# Patient Record
Sex: Male | Born: 1945 | Race: White | Hispanic: No | Marital: Married | Smoking: Never smoker
Health system: Southern US, Community
[De-identification: ages and names within clinical notes are randomized; demographics above are authoritative.]

## PROBLEM LIST (undated history)

## (undated) DIAGNOSIS — Z955 Presence of coronary angioplasty implant and graft: Secondary | ICD-10-CM

## (undated) DIAGNOSIS — M1990 Unspecified osteoarthritis, unspecified site: Secondary | ICD-10-CM

---

## 2013-02-12 HISTORY — PX: CORONARY STENT INTERVENTION: CATH118234

## 2018-12-26 ENCOUNTER — Emergency Department (HOSPITAL_COMMUNITY): Payer: Medicare Other

## 2018-12-26 ENCOUNTER — Emergency Department (HOSPITAL_COMMUNITY)
Admission: EM | Admit: 2018-12-26 | Discharge: 2018-12-26 | Disposition: A | Discharge status: Home / Self Care | Payer: Medicare Other | Attending: Emergency Medicine | Admitting: Emergency Medicine

## 2018-12-26 ENCOUNTER — Encounter (HOSPITAL_COMMUNITY): Payer: Self-pay | Admitting: Emergency Medicine

## 2018-12-26 ENCOUNTER — Other Ambulatory Visit: Payer: Self-pay

## 2018-12-26 DIAGNOSIS — H539 Unspecified visual disturbance: Secondary | ICD-10-CM | POA: Diagnosis not present

## 2018-12-26 HISTORY — DX: Unspecified osteoarthritis, unspecified site: M19.90

## 2018-12-26 HISTORY — DX: Presence of coronary angioplasty implant and graft: Z95.5

## 2018-12-26 LAB — DIFFERENTIAL
Abs Immature Granulocytes: 0.03 10*3/uL (ref 0.00–0.07)
Basophils Absolute: 0.1 10*3/uL (ref 0.0–0.1)
Basophils Relative: 1 %
Eosinophils Absolute: 0.6 10*3/uL — ABNORMAL HIGH (ref 0.0–0.5)
Eosinophils Relative: 5 %
Immature Granulocytes: 0 %
Lymphocytes Relative: 34 %
Lymphs Abs: 3.9 10*3/uL (ref 0.7–4.0)
Monocytes Absolute: 1 10*3/uL (ref 0.1–1.0)
Monocytes Relative: 9 %
Neutro Abs: 5.8 10*3/uL (ref 1.7–7.7)
Neutrophils Relative %: 51 %

## 2018-12-26 LAB — COMPREHENSIVE METABOLIC PANEL
ALT: 25 U/L (ref 0–44)
AST: 26 U/L (ref 15–41)
Albumin: 4.1 g/dL (ref 3.5–5.0)
Alkaline Phosphatase: 58 U/L (ref 38–126)
Anion gap: 12 (ref 5–15)
BUN: 12 mg/dL (ref 8–23)
CO2: 24 mmol/L (ref 22–32)
Calcium: 9.6 mg/dL (ref 8.9–10.3)
Chloride: 104 mmol/L (ref 98–111)
Creatinine, Ser: 1.09 mg/dL (ref 0.61–1.24)
GFR calc Af Amer: >60 mL/min (ref >60)
GFR calc non Af Amer: >60 mL/min (ref >60)
Glucose, Bld: 105 mg/dL — ABNORMAL HIGH (ref 70–99)
Potassium: 4 mmol/L (ref 3.5–5.1)
Sodium: 140 mmol/L (ref 135–145)
Total Bilirubin: 0.9 mg/dL (ref 0.3–1.2)
Total Protein: 7.6 g/dL (ref 6.5–8.1)

## 2018-12-26 LAB — I-STAT CHEM 8, ED
BUN: 13 mg/dL (ref 8–23)
Calcium, Ion: 1.17 mmol/L (ref 1.15–1.40)
Chloride: 103 mmol/L (ref 98–111)
Creatinine, Ser: 1.1 mg/dL (ref 0.61–1.24)
Glucose, Bld: 101 mg/dL — ABNORMAL HIGH (ref 70–99)
HCT: 47 % (ref 39.0–52.0)
Hemoglobin: 16 g/dL (ref 13.0–17.0)
Potassium: 3.7 mmol/L (ref 3.5–5.1)
Sodium: 141 mmol/L (ref 135–145)
TCO2: 23 mmol/L (ref 22–32)

## 2018-12-26 LAB — CBC
HCT: 48.4 % (ref 39.0–52.0)
Hemoglobin: 16.1 g/dL (ref 13.0–17.0)
MCH: 31.2 pg (ref 26.0–34.0)
MCHC: 33.3 g/dL (ref 30.0–36.0)
MCV: 93.8 fL (ref 80.0–100.0)
Platelets: 232 10*3/uL (ref 150–400)
RBC: 5.16 MIL/uL (ref 4.22–5.81)
RDW: 12.5 % (ref 11.5–15.5)
WBC: 11.5 10*3/uL — ABNORMAL HIGH (ref 4.0–10.5)
nRBC: 0 % (ref 0.0–0.2)

## 2018-12-26 LAB — CBG MONITORING, ED: Glucose-Capillary: 103 mg/dL — ABNORMAL HIGH (ref 70–99)

## 2018-12-26 LAB — PROTIME-INR
INR: 1.1 (ref 0.8–1.2)
Prothrombin Time: 13.7 seconds (ref 11.4–15.2)

## 2018-12-26 LAB — APTT: aPTT: 32 seconds (ref 24–36)

## 2018-12-26 MED ORDER — SODIUM CHLORIDE 0.9% FLUSH: 3.0000 mL | Freq: Once | INTRAVENOUS | Status: DC

## 2018-12-26 NOTE — ED Notes (Signed)
Patient transported to MRI 

## 2018-12-26 NOTE — Discharge Instructions (Signed)
You were seen in the emergency department today with changes in your vision.  The MRI of your brain did not show a stroke.  Please continue to follow with your eye doctor.  Return to the emergency department any new or worsening symptom such as weakness, numbness, worsening vision change, difficulty swallowing, difficulty speaking.

## 2018-12-26 NOTE — ED Triage Notes (Signed)
Pt is coming from the Retina Specialist for seeing streaks of red, blue, and yellow in his eyes 3 weeks ago felt like it was in his head. They want him to be evaluated for stroke. There is no slurred speech, facial droop. Grips equal .  Eyes are dilated

## 2018-12-26 NOTE — ED Notes (Signed)
EDP notified. 

## 2018-12-26 NOTE — ED Provider Notes (Signed)
Emergency Department Provider Note   I have reviewed the triage vital signs and the nursing notes.   HISTORY  Chief Complaint Vison Changes and Extremity Weakness   HPI Alfred Gonzalez is a 73 y.o. male with past medical history reviewed below presents to the emergency department with vision change 3 weeks ago but ultimately referred here by his retina specialist for stroke evaluation.  Patient states that 3 weeks ago he developed streaks of red, blue, yellow in his eyes bilaterally.  He went about his day but felt like at times he was missing part of his field of vision.  He did have headache at the time and does have history of migraine but states that the headache resolved but the vision disturbance remained.  He saw his eye doctor yesterday who referred him to a retina specialist today who, by patient report, did not find a retina defect or other cause for the symptoms and referred the patient to the emergency department for further evaluation.  Patient states that over the past 3 weeks he feels like his vision is actually improving overall.  He is not having any weakness, numbness, speech disturbance, or difficulty swallowing.  He has no prior history of stroke.  He has not had return of headache.  No fevers, chills, heart palpitations.   Past Medical History:  Diagnosis Date   Arthritis    History of coronary angioplasty with insertion of stent     There are no active problems to display for this patient.   Past Surgical History:  Procedure Laterality Date   CORONARY STENT INTERVENTION  2015    Allergies Patient has no allergy information on record.  No family history on file.  Social History Social History   Tobacco Use   Smoking status: Never Smoker   Smokeless tobacco: Never Used  Substance Use Topics   Alcohol use: Yes   Drug use: Not Currently    Review of Systems  Constitutional: No fever/chills Eyes: Positive vision changes.  ENT: No sore  throat. Cardiovascular: Denies chest pain. Respiratory: Denies shortness of breath. Gastrointestinal: No abdominal pain.  No nausea, no vomiting.  No diarrhea.  No constipation. Genitourinary: Negative for dysuria. Musculoskeletal: Negative for back pain. Skin: Negative for rash. Neurological: Negative for headaches, focal weakness or numbness.  10-point ROS otherwise negative.  ____________________________________________   PHYSICAL EXAM:  VITAL SIGNS: ED Triage Vitals  Enc Vitals Group     BP 12/26/18 1415 (!) 171/82     Pulse Rate 12/26/18 1415 60     Resp 12/26/18 1415 16     Temp 12/26/18 1415 98.3 F (36.8 C)     Temp Source 12/26/18 1415 Oral     SpO2 12/26/18 1415 98 %   Constitutional: Alert and oriented. Well appearing and in no acute distress. Eyes: Conjunctivae are normal. Pupils are dilated bilaterally after exam at ophthalmology immediately prior to ED presentation.  Head: Atraumatic. Nose: No congestion/rhinnorhea. Mouth/Throat: Mucous membranes are moist.   Neck: No stridor.  Cardiovascular: Normal rate, regular rhythm. Good peripheral circulation. Grossly normal heart sounds.   Respiratory: Normal respiratory effort.  No retractions. Lungs CTAB. Gastrointestinal: Soft and nontender. No distention.  Musculoskeletal: No lower extremity tenderness nor edema. Neurologic:  Normal speech and language.  Skin:  Skin is warm, dry and intact. No rash noted.   ____________________________________________   LABS (all labs ordered are listed, but only abnormal results are displayed)  Labs Reviewed  CBC - Abnormal; Notable for  the following components:      Result Value   WBC 11.5 (*)    All other components within normal limits  DIFFERENTIAL - Abnormal; Notable for the following components:   Eosinophils Absolute 0.6 (*)    All other components within normal limits  COMPREHENSIVE METABOLIC PANEL - Abnormal; Notable for the following components:   Glucose,  Bld 105 (*)    All other components within normal limits  I-STAT CHEM 8, ED - Abnormal; Notable for the following components:   Glucose, Bld 101 (*)    All other components within normal limits  CBG MONITORING, ED - Abnormal; Notable for the following components:   Glucose-Capillary 103 (*)    All other components within normal limits  PROTIME-INR  APTT   ____________________________________________  EKG   EKG Interpretation  Date/Time:  Friday December 26 2018 14:58:59 EST Ventricular Rate:  65 PR Interval:  176 QRS Duration: 80 QT Interval:  404 QTC Calculation: 420 R Axis:   57 Text Interpretation: Sinus rhythm with occasional Premature ventricular complexes Otherwise normal ECG No STEMI Confirmed by Alona BeneLong, Trachelle Low [phone]([id number]) on 12/26/2018 5:40:04 PM       ____________________________________________  RADIOLOGY  Ct Head Wo Contrast  Result Date: 12/26/2018 CLINICAL DATA:  Visual loss or uveitis/scleritis. Possible stroke. Bilateral visual loss 3 weeks. EXAM: CT HEAD WITHOUT CONTRAST TECHNIQUE: Contiguous axial images were obtained from the base of the skull through the vertex without intravenous contrast. COMPARISON:  None. FINDINGS: Brain: Ventricles, cisterns and other CSF spaces are normal. There is mild chronic ischemic microvascular disease. There is no mass, mass effect, shift of midline structures or acute hemorrhage. No evidence of acute infarction. Calcification over the cerebellar hemispheres bilaterally right worse than left. Vascular: No hyperdense vessel or unexpected calcification. Skull: Normal. Negative for fracture or focal lesion. Sinuses/Orbits: Orbits are normal. Paranasal sinuses are well developed with minimal opacification over the ethmoid sinus and mild mucosal membrane thickening over the left maxillary sinus. Mastoid air cells are clear. Other: None. IMPRESSION: 1.  No acute findings. 2. Minimal chronic ischemic microvascular disease. Calcification over the  cerebellar hemispheres bilaterally right worse than left. Electronically Signed   By: Elberta Fortisaniel  Boyle M.D.   On: 12/26/2018 15:01   Mr Brain Wo Contrast  Result Date: 12/26/2018 CLINICAL DATA:  Initial evaluation for acute visual change. EXAM: MRI HEAD WITHOUT CONTRAST TECHNIQUE: Multiplanar, multiecho pulse sequences of the brain and surrounding structures were obtained without intravenous contrast. COMPARISON:  Prior head CT from earlier the same day. FINDINGS: Brain: Generalized age-related cerebral atrophy. Mild T2/FLAIR hyperintensity seen involving the periventricular and deep white matter both cerebral hemispheres, most consistent with chronic small vessel ischemic disease, minimal for age. Small focus of encephalomalacia of with chronic hemosiderin staining seen involving the left occipital lobe, likely a remote ischemic infarct. Susceptibility artifact related to cerebellar calcification noted as well. Tiny remote right cerebellar infarct noted. No abnormal foci of restricted diffusion to suggest acute or subacute ischemia. Gray-white matter differentiation maintained. No other areas of remote cortical infarction. No acute intracranial hemorrhage. No mass lesion, midline shift or mass effect. No hydrocephalus. No extra-axial fluid collection. Pituitary gland suprasellar region normal. Midline structures intact. Vascular: Major intracranial vascular flow voids are maintained. Skull and upper cervical spine: Craniocervical junction within normal limits. Degenerative disc bulging at C3-4 with resultant mild spinal stenosis. Bone marrow signal intensity within normal limits. No scalp soft tissue abnormality. Sinuses/Orbits: Globes and orbital soft tissues demonstrate no acute finding. Mild scattered  mucosal thickening noted within the ethmoidal air cells. Paranasal sinuses are otherwise clear. No significant mastoid effusion. Inner ear structures normal. Other: None. IMPRESSION: 1. No acute intracranial  abnormality. 2. 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 Electronically Signed   By: Rise Mu M.D.   On: 12/26/2018 19:15    ____________________________________________   PROCEDURES  Procedure(s) performed:   Procedures  None  ____________________________________________   INITIAL IMPRESSION / ASSESSMENT AND PLAN / ED COURSE  Pertinent labs & imaging results that were available during my care of the patient were reviewed by me and considered in my medical decision making (see chart for details).   Patient presents to the emergency department for evaluation of vision change first noticed 3 weeks ago and gradually improving but ultimately referred to the emergency department by ophthalmology for stroke evaluation.  Patient has an unremarkable neuro exam with no focal deficits on my evaluation.  Plan for MRI brain and reassess.   MRI brain negative for acute infarct. Labs reassuring. Plan for discharge with plan for outpatient ophthalmology follow up. Discussed ED return precautions in detail.  ____________________________________________  FINAL CLINICAL IMPRESSION(S) / ED DIAGNOSES  Final diagnoses:  Vision changes    MEDICATIONS GIVEN DURING THIS VISIT:  Medications  sodium chloride flush (NS) 0.9 % injection 3 mL (has no administration in time range)    Note:  This document was prepared using Dragon voice recognition software and may include unintentional dictation errors.  Alona Bene, MD, [facility] Emergency Medicine    Anatalia Kronk, Arlyss Repress, MD 12/26/18 2010

## 2018-12-26 NOTE — ED Notes (Signed)
Discharge instructions discussed with pt. Pt verbalized understanding. Pt stable and ambulatory. No signature pad available. 

## 2020-04-02 IMAGING — MR MR HEAD W/O CM
12 of 13 series · 44 of 48 positions shown · non-contrast
Comparison: Prior head CT from earlier the same day.

CLINICAL DATA: Initial evaluation for acute visual change.

EXAM:
MRI HEAD WITHOUT CONTRAST
TECHNIQUE: Multiplanar, multiecho pulse sequences of the brain and surrounding
structures were obtained without intravenous contrast.

[Series 5: DWI · axial · 3.0mm · 0.88mm/px · z∈[-85,+67]mm · 8 of 104 slices shown (1 of 4)]
[im 1/104]
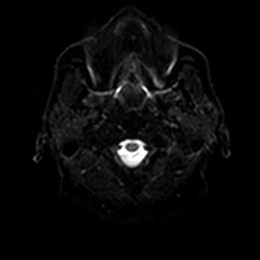
[im 15/104]
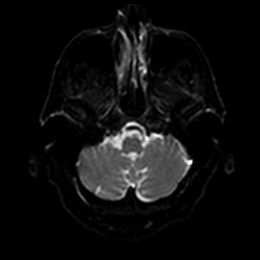
[im 30/104]
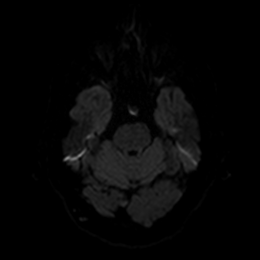
[im 45/104]
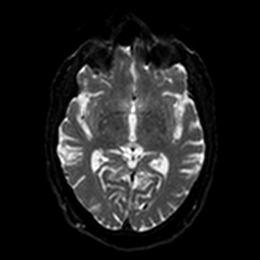
[im 59/104]
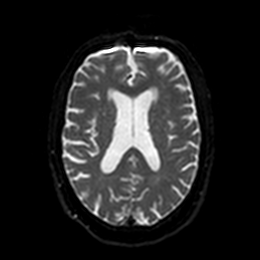
[im 74/104]
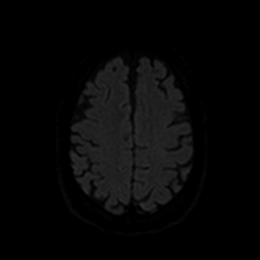
[im 89/104]
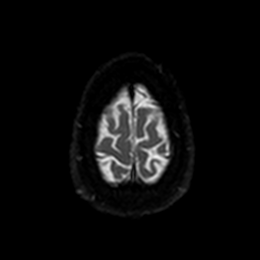
[im 104/104]
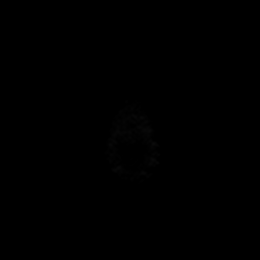

[Series 6: DWI · axial · 3.0mm · 0.88mm/px · z∈[-85,+67]mm · 4 of 52 slices shown (2 of 4)]
[im 1/52]
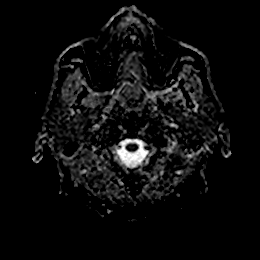
[im 18/52]
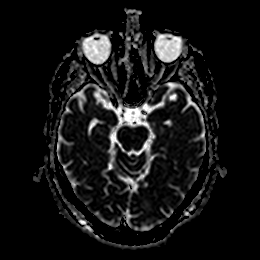
[im 35/52]
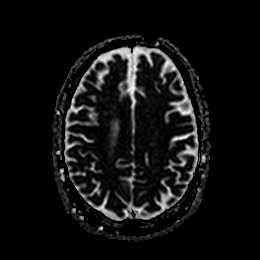
[im 52/52]
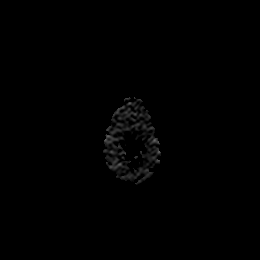

[Series 7: DWI · coronal · 4.0mm · 0.88mm/px · 5 of 72 slices shown (3 of 4)]
[im 1/72]
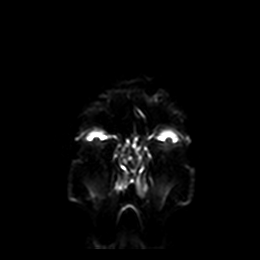
[im 18/72]
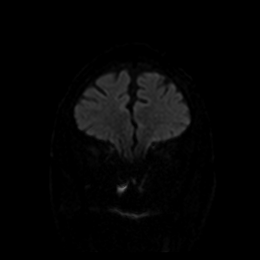
[im 36/72]
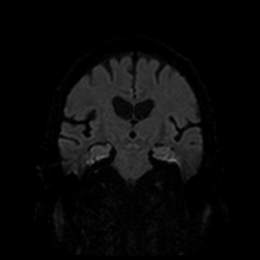
[im 54/72]
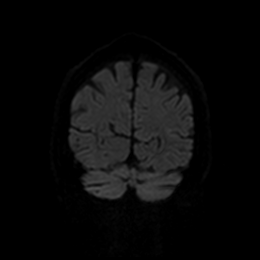
[im 72/72]
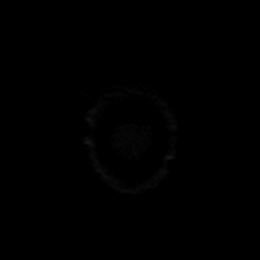

[Series 8: DWI · coronal · 4.0mm · 0.88mm/px · 3 of 36 slices shown (4 of 4)]
[im 1/36]
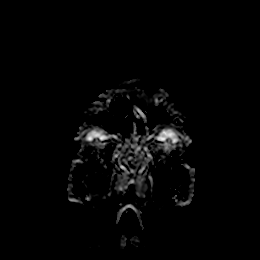
[im 18/36]
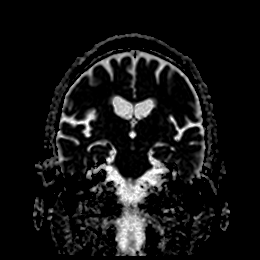
[im 36/36]
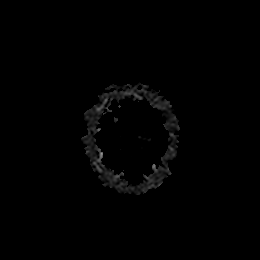

[Series 9: FLAIR · axial · 5.0mm · 0.45mm/px · z∈[-89,+61]mm · 2 of 26 slices shown]
[im 1/26]
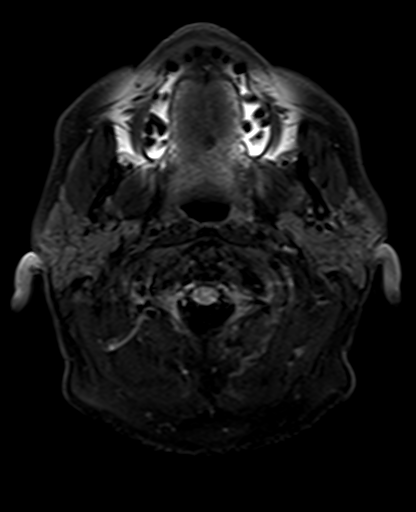
[im 26/26]
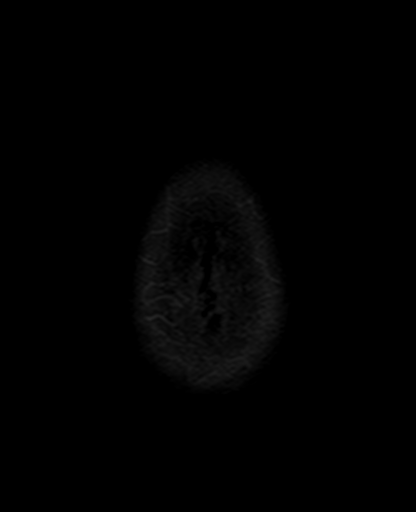

[Series 10: mag_images · axial · 3.0mm · 0.90mm/px · z∈[-102,+74]mm · 4 of 60 slices shown]
[im 1/60]
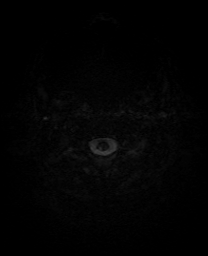
[im 20/60]
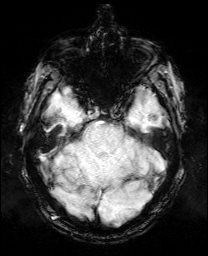
[im 40/60]
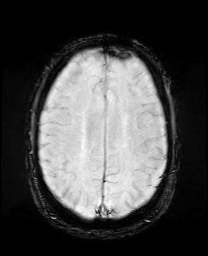
[im 60/60]
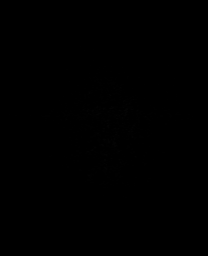

[Series 11: pha_images · axial · 3.0mm · 0.90mm/px · z∈[-102,+71]mm · 4 of 58 slices shown]
[im 1/58]
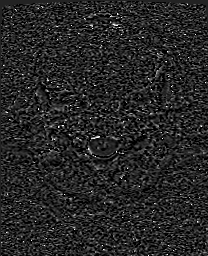
[im 20/58]
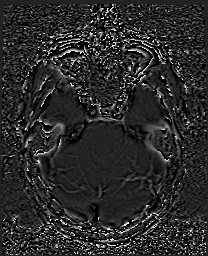
[im 39/58]
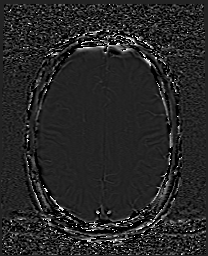
[im 58/58]
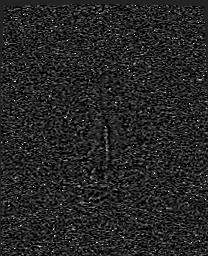

[Series 12: swi_images · axial · 3.0mm · 0.90mm/px · z∈[-102,+74]mm · 4 of 60 slices shown]
[im 1/60]
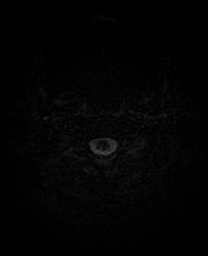
[im 20/60]
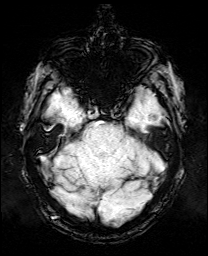
[im 40/60]
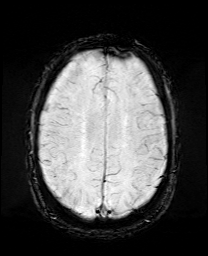
[im 60/60]
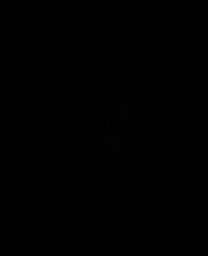

[Series 13: mip_images(sw) · axial · 24.0mm · 0.90mm/px · z∈[-92,+64]mm · 4 of 53 slices shown]
[im 1/53]
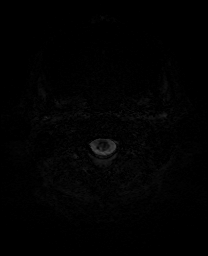
[im 18/53]
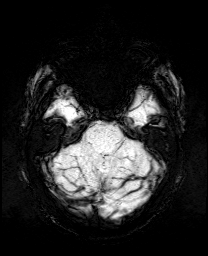
[im 35/53]
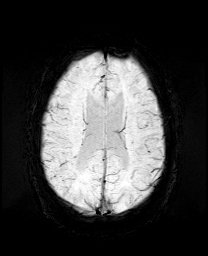
[im 53/53]
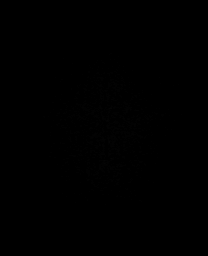

[Series 14: T1 · sagittal · 5.0mm · 0.78mm/px · 2 of 25 slices shown]
[im 1/25]
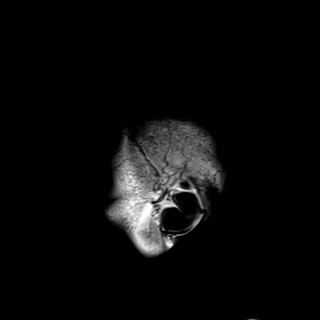
[im 25/25]
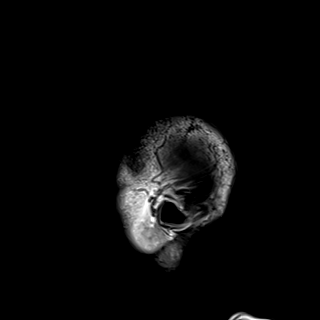

[Series 15: T2 · axial · 5.0mm · 0.72mm/px · z∈[-84,+66]mm · 2 of 26 slices shown (1 of 2)]
[im 1/26]
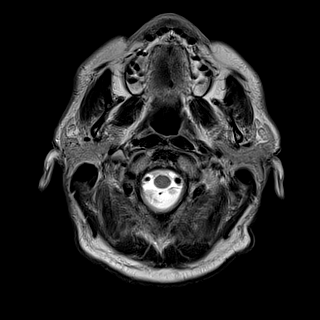
[im 26/26]
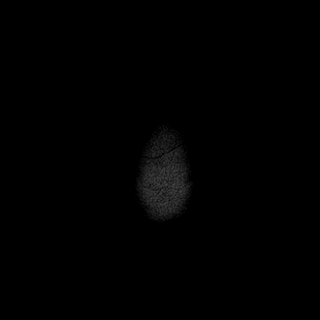

[Series 17: T2 · coronal · 5.0mm · 0.34mm/px · 2 of 30 slices shown (2 of 2)]
[im 1/30]
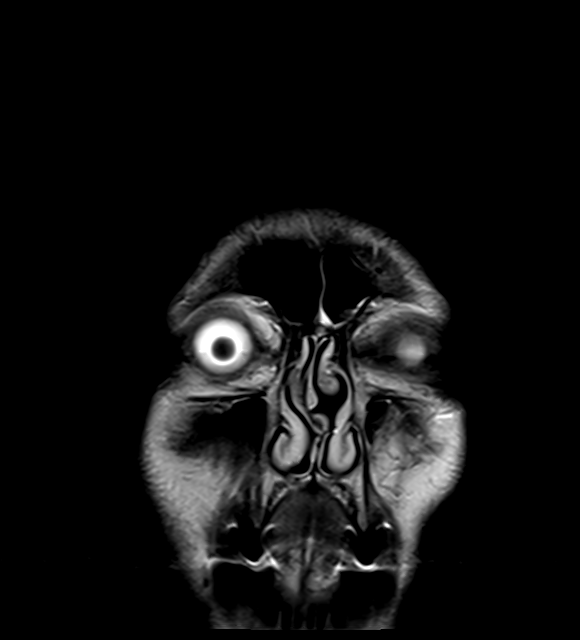
[im 30/30]
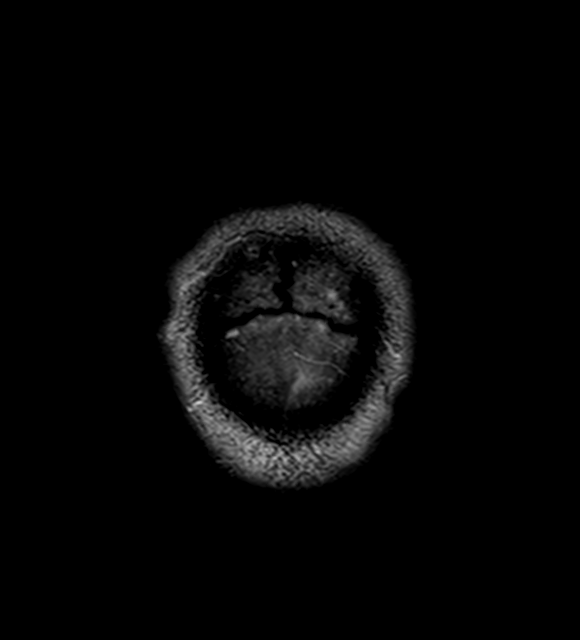

[44 of 48 positions shown; findings below may reference images not displayed]

FINDINGS: Brain: Generalized age-related cerebral atrophy. Mild T2/FLAIR
hyperintensity seen involving the periventricular and deep white
matter both cerebral hemispheres, most consistent with chronic small
vessel ischemic disease, minimal for age. Small focus of
encephalomalacia of with chronic hemosiderin staining seen involving
the left occipital lobe, likely a remote ischemic infarct.
Susceptibility artifact related to cerebellar calcification noted as
well. Tiny remote right cerebellar infarct noted.

No abnormal foci of restricted diffusion to suggest acute or
subacute ischemia. Gray-white matter differentiation maintained. No
other areas of remote cortical infarction. No acute intracranial
hemorrhage.

No mass lesion, midline shift or mass effect. No hydrocephalus. No
extra-axial fluid collection. Pituitary gland suprasellar region
normal. Midline structures intact.

Vascular: Major intracranial vascular flow voids are maintained.

Skull and upper cervical spine: Craniocervical junction within
normal limits. Degenerative disc bulging at C3-4 with resultant mild
spinal stenosis. Bone marrow signal intensity within normal limits.
No scalp soft tissue abnormality.

Sinuses/Orbits: Globes and orbital soft tissues demonstrate no acute
finding. Mild scattered mucosal thickening noted within the
ethmoidal air cells. Paranasal sinuses are otherwise clear. No
significant mastoid effusion. Inner ear structures normal.

Other: None.
IMPRESSION: 1. No acute intracranial abnormality.
2. Small remote left occipital and right cerebellar infarcts, with
underlying mild chronic microvascular ischemic disease.
